# Patient Record
Sex: Male | Born: 1965 | Race: White | Hispanic: No | Marital: Married | Smoking: Never smoker
Health system: Southern US, Community
[De-identification: ages and names within clinical notes are randomized; demographics above are authoritative.]

## PROBLEM LIST (undated history)

## (undated) DIAGNOSIS — I10 Essential (primary) hypertension: Secondary | ICD-10-CM

## (undated) DIAGNOSIS — E119 Type 2 diabetes mellitus without complications: Secondary | ICD-10-CM

## (undated) HISTORY — PX: MEDIAL COLLATERAL LIGAMENT AND LATERAL COLLATERAL LIGAMENT REPAIR, KNEE: SHX2017

## (undated) HISTORY — PX: GASTRIC BYPASS: SHX52

---

## 2018-03-05 ENCOUNTER — Emergency Department (HOSPITAL_BASED_OUTPATIENT_CLINIC_OR_DEPARTMENT_OTHER)
Admission: EM | Admit: 2018-03-05 | Discharge: 2018-03-05 | Disposition: A | Discharge status: Home / Self Care | Payer: Medicare Other | Attending: Emergency Medicine | Admitting: Emergency Medicine

## 2018-03-05 ENCOUNTER — Other Ambulatory Visit: Payer: Self-pay

## 2018-03-05 ENCOUNTER — Encounter (HOSPITAL_BASED_OUTPATIENT_CLINIC_OR_DEPARTMENT_OTHER): Payer: Self-pay | Admitting: Emergency Medicine

## 2018-03-05 ENCOUNTER — Emergency Department (HOSPITAL_BASED_OUTPATIENT_CLINIC_OR_DEPARTMENT_OTHER): Payer: Medicare Other

## 2018-03-05 DIAGNOSIS — I10 Essential (primary) hypertension: Secondary | ICD-10-CM | POA: Insufficient documentation

## 2018-03-05 DIAGNOSIS — M79604 Pain in right leg: Secondary | ICD-10-CM

## 2018-03-05 DIAGNOSIS — M545 Low back pain, unspecified: Secondary | ICD-10-CM

## 2018-03-05 DIAGNOSIS — Z7984 Long term (current) use of oral hypoglycemic drugs: Secondary | ICD-10-CM | POA: Diagnosis not present

## 2018-03-05 DIAGNOSIS — W19XXXA Unspecified fall, initial encounter: Secondary | ICD-10-CM

## 2018-03-05 DIAGNOSIS — Y9301 Activity, walking, marching and hiking: Secondary | ICD-10-CM | POA: Diagnosis not present

## 2018-03-05 DIAGNOSIS — Z79899 Other long term (current) drug therapy: Secondary | ICD-10-CM | POA: Insufficient documentation

## 2018-03-05 DIAGNOSIS — W010XXA Fall on same level from slipping, tripping and stumbling without subsequent striking against object, initial encounter: Secondary | ICD-10-CM | POA: Diagnosis not present

## 2018-03-05 DIAGNOSIS — Y92813 Airplane as the place of occurrence of the external cause: Secondary | ICD-10-CM | POA: Insufficient documentation

## 2018-03-05 DIAGNOSIS — E119 Type 2 diabetes mellitus without complications: Secondary | ICD-10-CM | POA: Insufficient documentation

## 2018-03-05 HISTORY — DX: Type 2 diabetes mellitus without complications: E11.9

## 2018-03-05 HISTORY — DX: Essential (primary) hypertension: I10

## 2018-03-05 MED ORDER — METHOCARBAMOL 500 MG PO TABS
750.0000 mg | ORAL_TABLET | Freq: Once | ORAL | Status: DC
  Filled 2018-03-05: qty 2

## 2018-03-05 MED ORDER — ACETAMINOPHEN 500 MG PO TABS
1000.0000 mg | ORAL_TABLET | Freq: Once | ORAL | Status: AC
  Administered 2018-03-05: 1000 mg via ORAL
  Filled 2018-03-05: qty 2

## 2018-03-05 MED ORDER — KETOROLAC TROMETHAMINE 15 MG/ML IJ SOLN
15.0000 mg | Freq: Once | INTRAMUSCULAR | Status: AC
  Administered 2018-03-05: 15 mg via INTRAMUSCULAR
  Filled 2018-03-05: qty 1

## 2018-03-05 MED ORDER — OXYCODONE-ACETAMINOPHEN 5-325 MG PO TABS
1.0000 | ORAL_TABLET | Freq: Once | ORAL | Status: DC
  Filled 2018-03-05: qty 1

## 2018-03-05 NOTE — ED Notes (Signed)
Patient transported to X-ray 

## 2018-03-05 NOTE — ED Notes (Signed)
Pt ambulated w/o difficulty. PA notified.

## 2018-03-05 NOTE — ED Notes (Signed)
Patient left at this time with all belongings. 

## 2018-03-05 NOTE — Discharge Instructions (Addendum)
Take your home pain medications as prescribed. Take your home muscle relaxers as prescribed. Use ice to help with pain and swelling. Use muscle creams (bengay, icy hot, salonpas) as needed for pain.  You will likely have continued muscle stiffness and soreness over the neck several days.  Follow-up with your primary care doctor in 1 week if your pain is not improving. Return to the ER if you develop also bowel bladder control, numbness, inability to walk, or any new, worsening, or concerning symptoms.

## 2018-03-05 NOTE — ED Notes (Signed)
Held pain meds, pt reports he is driving himself home.

## 2018-03-05 NOTE — ED Provider Notes (Signed)
[facility] EMERGENCY DEPARTMENT Provider Note   CSN: [id number] Arrival date & time: 03/05/18  1927     History   Chief Complaint Chief Complaint  Patient presents with  . Fall    HPI Kurt Graham is a 53 y.o. male presenting for evaluation after a fall.  Patient states he was disembarking from an airplane where the ramp was wet, and he slipped.  His right leg bent under him, and then was extended straight.  He landed on his low back.  He denies hitting his head or loss of consciousness.  He denies head or neck pain.  He is not on blood thinners.  He reports significant pain of his low back, right leg, right knee, and right ankle.  He has not taken anything for pain including Tylenol or ibuprofen.  Movement and palpation makes the pain worse, nothing make it better.  He denies numbness or tingling.  He denies upper back pain, chest pain breath, nausea, vomiting, abdominal pain, loss of bowel bladder control.  Patient states he is on chronic pain medication, but has not had any recently due to travel.  HPI  Past Medical History:  Diagnosis Date  . Diabetes mellitus without complication (HCC)   . Hypertension     There are no active problems to display for this patient.   Past Surgical History:  Procedure Laterality Date  . GASTRIC BYPASS    . MEDIAL COLLATERAL LIGAMENT AND LATERAL COLLATERAL LIGAMENT REPAIR, KNEE Right         Home Medications    Prior to Admission medications   Medication Sig Start Date End Date Taking? Authorizing Provider  benazepril (LOTENSIN) 40 MG tablet TAKE ONE TABLET BY MOUTH DAILY 07/23/14  Yes [provider]  metFORMIN (GLUCOPHAGE) 1000 MG tablet TAKE 1 TABLET (1,000 MG TOTAL) BY MOUTH 2 TIMES A DAY WITH MEALS 08/24/14  Yes [provider]  oxyCODONE-acetaminophen (PERCOCET) 10-325 MG tablet Take by mouth. 02/19/18 04/20/18 Yes [provider]    Family History No family history on file.  Social  History Social History   Tobacco Use  . Smoking status: Never Smoker  . Smokeless tobacco: Never Used  Substance Use Topics  . Alcohol use: Not Currently  . Drug use: Not Currently     Allergies   Other   Review of Systems Review of Systems  Musculoskeletal: Positive for arthralgias and back pain.  All other systems reviewed and are negative.    Physical Exam Updated Vital Signs BP (!) 149/105 (BP Location: Right Arm) Comment (BP Location): Right forearm  Pulse 87   Temp (!) 97.5 F (36.4 C) (Oral)   Resp 20   Ht 6\' 1"  (1.854 m)   Wt (!) 181.4 kg   SpO2 100%   BMI 52.77 kg/m   Physical Exam Vitals signs and nursing note reviewed.  Constitutional:      General: He is not in acute distress.    Appearance: He is well-developed.     Comments: Obese M in NAD  HENT:     Head: Normocephalic and atraumatic.     Comments: No obvious head injury Eyes:     Extraocular Movements: Extraocular movements intact.     Conjunctiva/sclera: Conjunctivae normal.     Pupils: Pupils are equal, round, and reactive to light.  Neck:     Musculoskeletal: Normal range of motion and neck supple.     Comments: No tenderness to palpation of neck or C-spine.  Moving head  easily without pain Cardiovascular:     Rate and Rhythm: Normal rate and regular rhythm.     Pulses: Normal pulses.  Pulmonary:     Effort: Pulmonary effort is normal. No respiratory distress.     Breath sounds: Normal breath sounds. No wheezing.  Abdominal:     General: There is no distension.     Palpations: Abdomen is soft. There is no mass.     Tenderness: There is no abdominal tenderness. There is no guarding or rebound.  Musculoskeletal: Normal range of motion.     Comments: No obvious deformity, rotation, shortening of the right leg.  Tenderness palpation of the right knee, thigh, hamstring.  No tenderness palpation of the hip or pelvis.  Tenderness palpation of low back bilaterally without increased pain over  the midline spine.  No step-offs or deformities.  No saddle paresthesias.  Pedal pulses intact bilaterally.  Sensation intact bilaterally.  Tenderness palpation of dorsal foot without swelling or deformity.  Good cap refill of all toes.  Skin:    General: Skin is warm and dry.     Capillary Refill: Capillary refill takes less than 2 seconds.  Neurological:     Mental Status: He is alert and oriented to person, place, and time.      ED Treatments / Results  Labs (all labs ordered are listed, but only abnormal results are displayed) Labs Reviewed - No data to display  EKG None  Radiology Dg Lumbar Spine Complete  Result Date: 03/05/2018 CLINICAL DATA:  Fall with back pain EXAM: LUMBAR SPINE - COMPLETE 4+ VIEW COMPARISON:  None. FINDINGS: Grade 1 anterolisthesis at L4-L5. Vertebral body heights are maintained. Disc spaces are normal. There is moderate facet hypertrophy at L4-5. IMPRESSION: Grade 1 anterolisthesis at L4-5 with moderate facet hypertrophy at this level. No acute fracture. Electronically Signed   By: Deatra RobinsonKevin  Herman M.D.   On: 03/05/2018 22:02   Dg Ankle Complete Right  Result Date: 03/05/2018 CLINICAL DATA:  Pain post slipping on wet floor. EXAM: RIGHT ANKLE - COMPLETE 3+ VIEW COMPARISON:  None. FINDINGS: There is no evidence of fracture, dislocation, or joint effusion. There is no evidence of arthropathy or other focal bone abnormality. Soft tissues are unremarkable. IMPRESSION: Negative. Electronically Signed   By: Ted Mcalpineobrinka  Dimitrova M.D.   On: 03/05/2018 22:02   Dg Knee Complete 4 Views Right  Result Date: 03/05/2018 CLINICAL DATA:  Fall with right knee pain EXAM: RIGHT KNEE - COMPLETE 4+ VIEW COMPARISON:  None. FINDINGS: Moderate femorotibial osteoarthrosis, medial compartment predominant. No fracture or dislocation. No joint effusion. Moderate patellofemoral osteoarthrosis. IMPRESSION: 1. No acute fracture or dislocation of the right knee. 2. Moderate femorotibial and  patellofemoral osteoarthrosis. Electronically Signed   By: Deatra RobinsonKevin  Herman M.D.   On: 03/05/2018 22:08   Dg Hip Unilat W Or Wo Pelvis 2-3 Views Right  Result Date: 03/05/2018 CLINICAL DATA:  Fall EXAM: DG HIP (WITH OR WITHOUT PELVIS) 2-3V RIGHT COMPARISON:  None. FINDINGS: There is no evidence of hip fracture or dislocation. No pelvic diastasis or fracture. There is no evidence of arthropathy or other focal bone abnormality. IMPRESSION: No fracture or dislocation of the right hip. Electronically Signed   By: Deatra RobinsonKevin  Herman M.D.   On: 03/05/2018 22:04   Dg Femur Min 2 Views Right  Result Date: 03/05/2018 CLINICAL DATA:  Fall with right knee pain EXAM: RIGHT FEMUR 2 VIEWS COMPARISON:  None. FINDINGS: There is no evidence of fracture or other focal bone lesions. Soft  tissues are unremarkable. IMPRESSION: No fracture or dislocation of the right femur. Electronically Signed   By: Deatra RobinsonKevin  Herman M.D.   On: 03/05/2018 22:03    Procedures Procedures (including critical care time)  Medications Ordered in ED Medications  oxyCODONE-acetaminophen (PERCOCET/ROXICET) 5-325 MG per tablet 1 tablet (0 tablets Oral Hold 03/05/18 2044)  methocarbamol (ROBAXIN) tablet 750 mg (0 mg Oral Hold 03/05/18 2044)  ketorolac (TORADOL) 15 MG/ML injection 15 mg (15 mg Intramuscular Given 03/05/18 2229)  acetaminophen (TYLENOL) tablet 1,000 mg (1,000 mg Oral Given 03/05/18 2229)     Initial Impression / Assessment and Plan / ED Course  I have reviewed the triage vital signs and the nursing notes.  Pertinent labs & imaging results that were available during my care of the patient were reviewed by me and considered in my medical decision making (see chart for details).     Patient presenting for evaluation of low back pain and right leg pain after a mechanical fall.  Physical exam reassuring, he is neurovascularly intact.  Patient with a history of degenerative disc disease in his back, history of MCL repair of his right knee.   Reporting significant pain.  Will obtain x-rays of his back, pelvis, femur, knee, and ankle for further evaluation.  Offered his home medication of Percocet and muscle relaxers, but patient is about to drive so declined.  Will hold on pain medication at this time.  X-rays viewed interpreted by me, no fracture dislocation.  Does show degenerative shin of his low back and arthritis of the right knee.  Discussed findings with patient.  Will give Toradol IM and high-dose Tylenol for pain control.  Patient ambulated without crutches and without significant difficulty.  Discussed typical course of muscle stiffness over the neck several days.  As patient has pain medication muscle relaxers at home, will not add.  Discussed use of muscle creams, rest, heat, ice.  With PCP if symptoms not improving.  At this time, patient appears safe for discharge.  Return precautions given.  Patient states he understands and agrees plan.  Final Clinical Impressions(s) / ED Diagnoses   Final diagnoses:  Fall, initial encounter  Midline low back pain without sciatica, unspecified chronicity  Right leg pain    ED Discharge Orders    None       Alveria ApleyCaccavale, Tabor Denham, PA-C 03/05/18 2333    Pricilla LovelessGoldston, Scott, MD 03/05/18 [id number]

## 2018-03-05 NOTE — ED Triage Notes (Signed)
[place]. Reports R knee pain and R back pain. Hx diabetes and HTN.

## 2020-03-08 IMAGING — CR DG KNEE COMPLETE 4+V*R*
4 series · 4 of 4 positions shown · non-contrast
Comparison: None.

CLINICAL DATA: Fall with right knee pain

EXAM:
RIGHT KNEE - COMPLETE 4+ VIEW

[t knee ap right *]
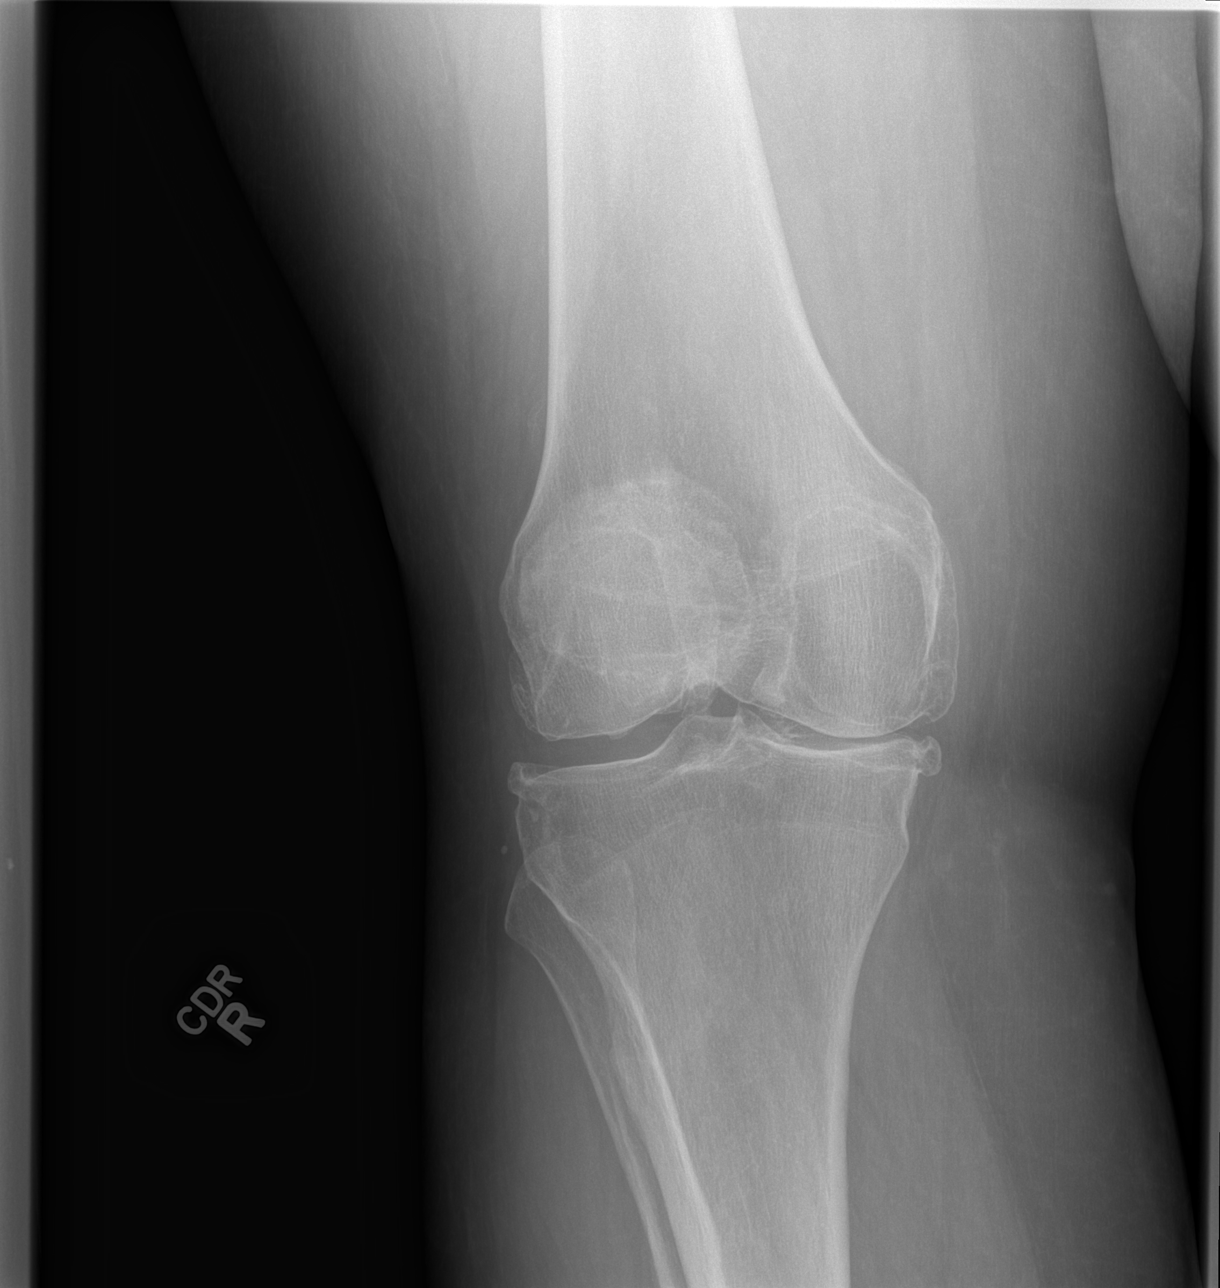

[t knee oblique right * (1 of 2)]
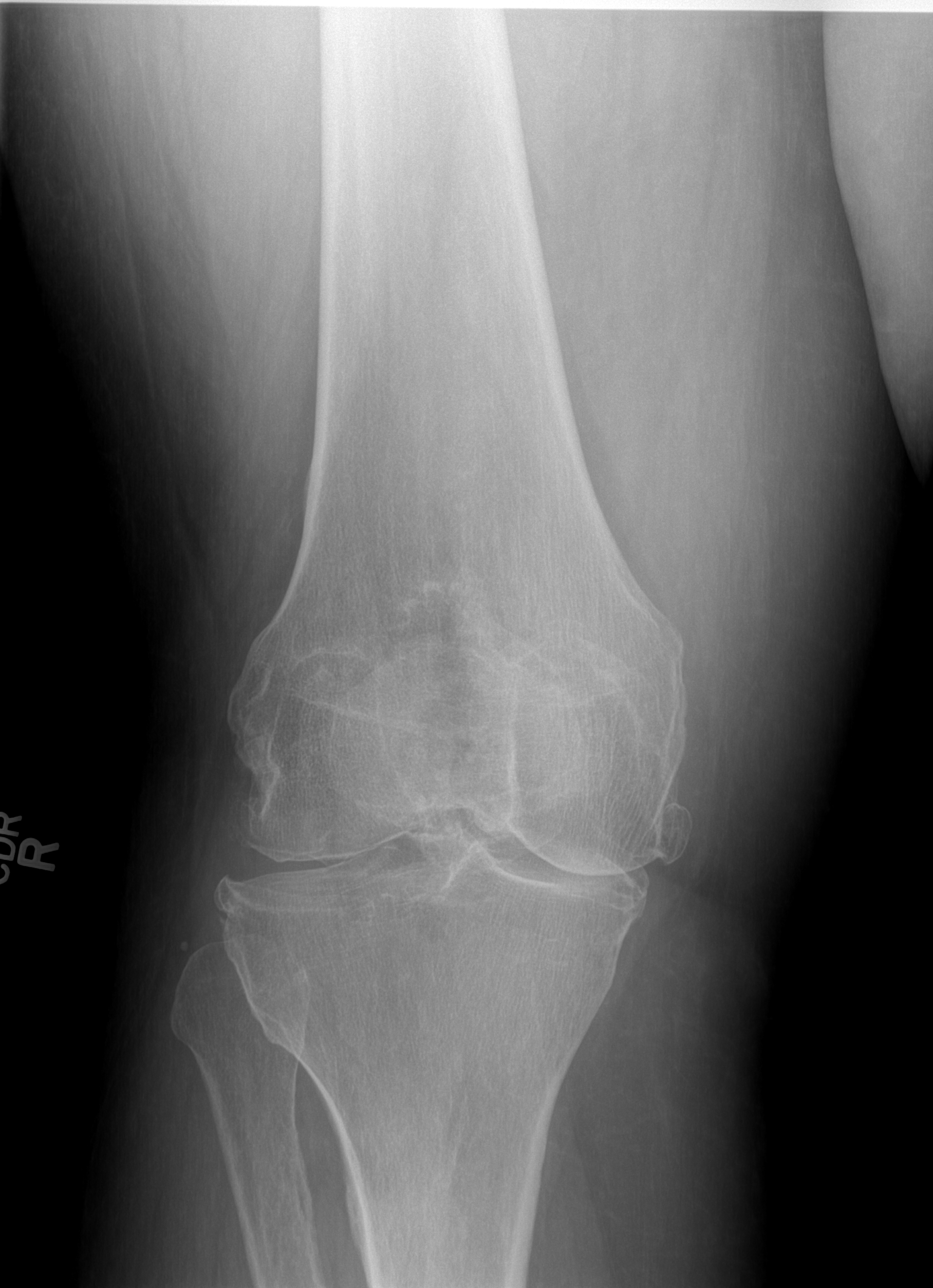

[t knee oblique right * (2 of 2)]
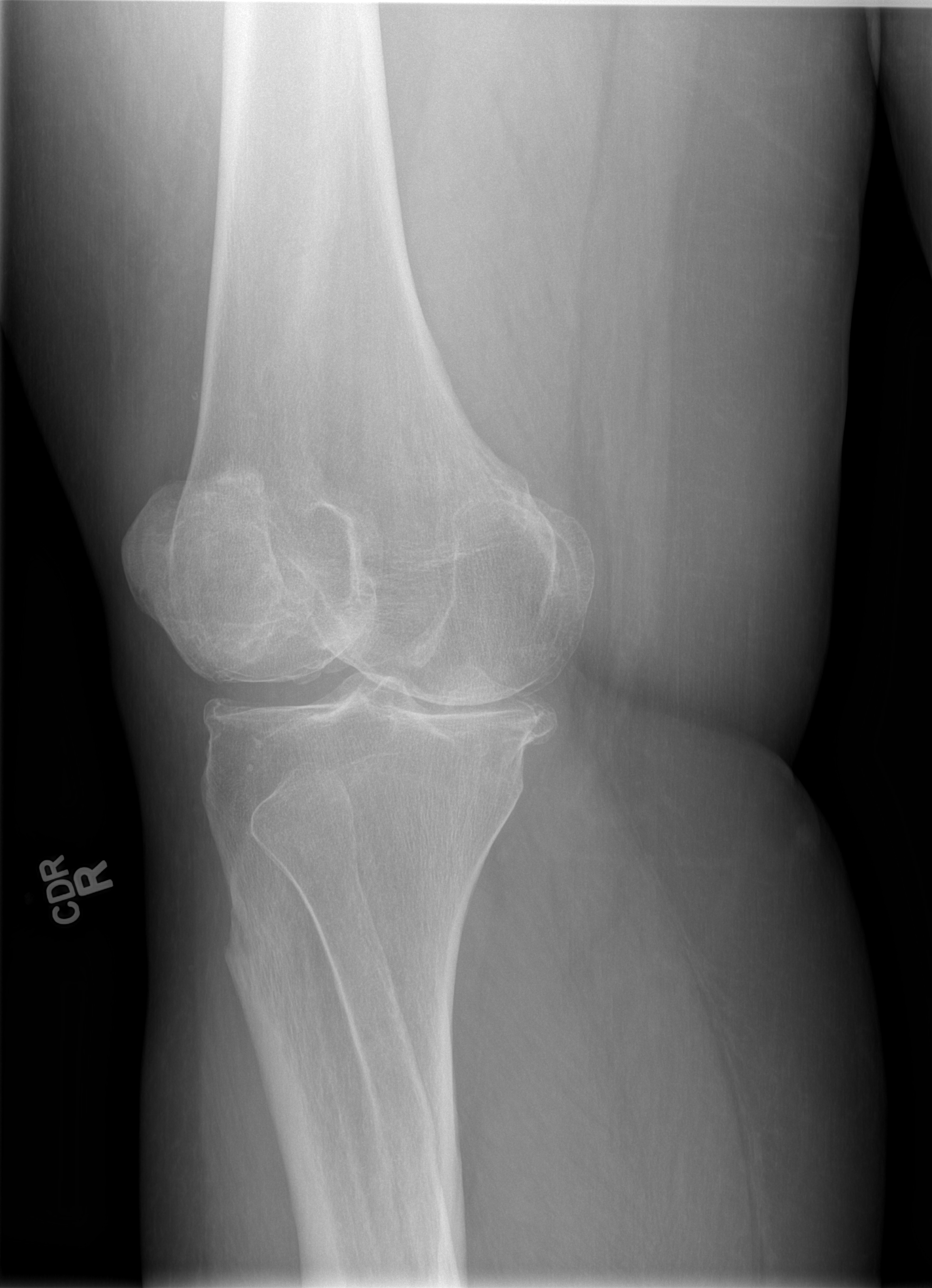

[t knee lat right *]
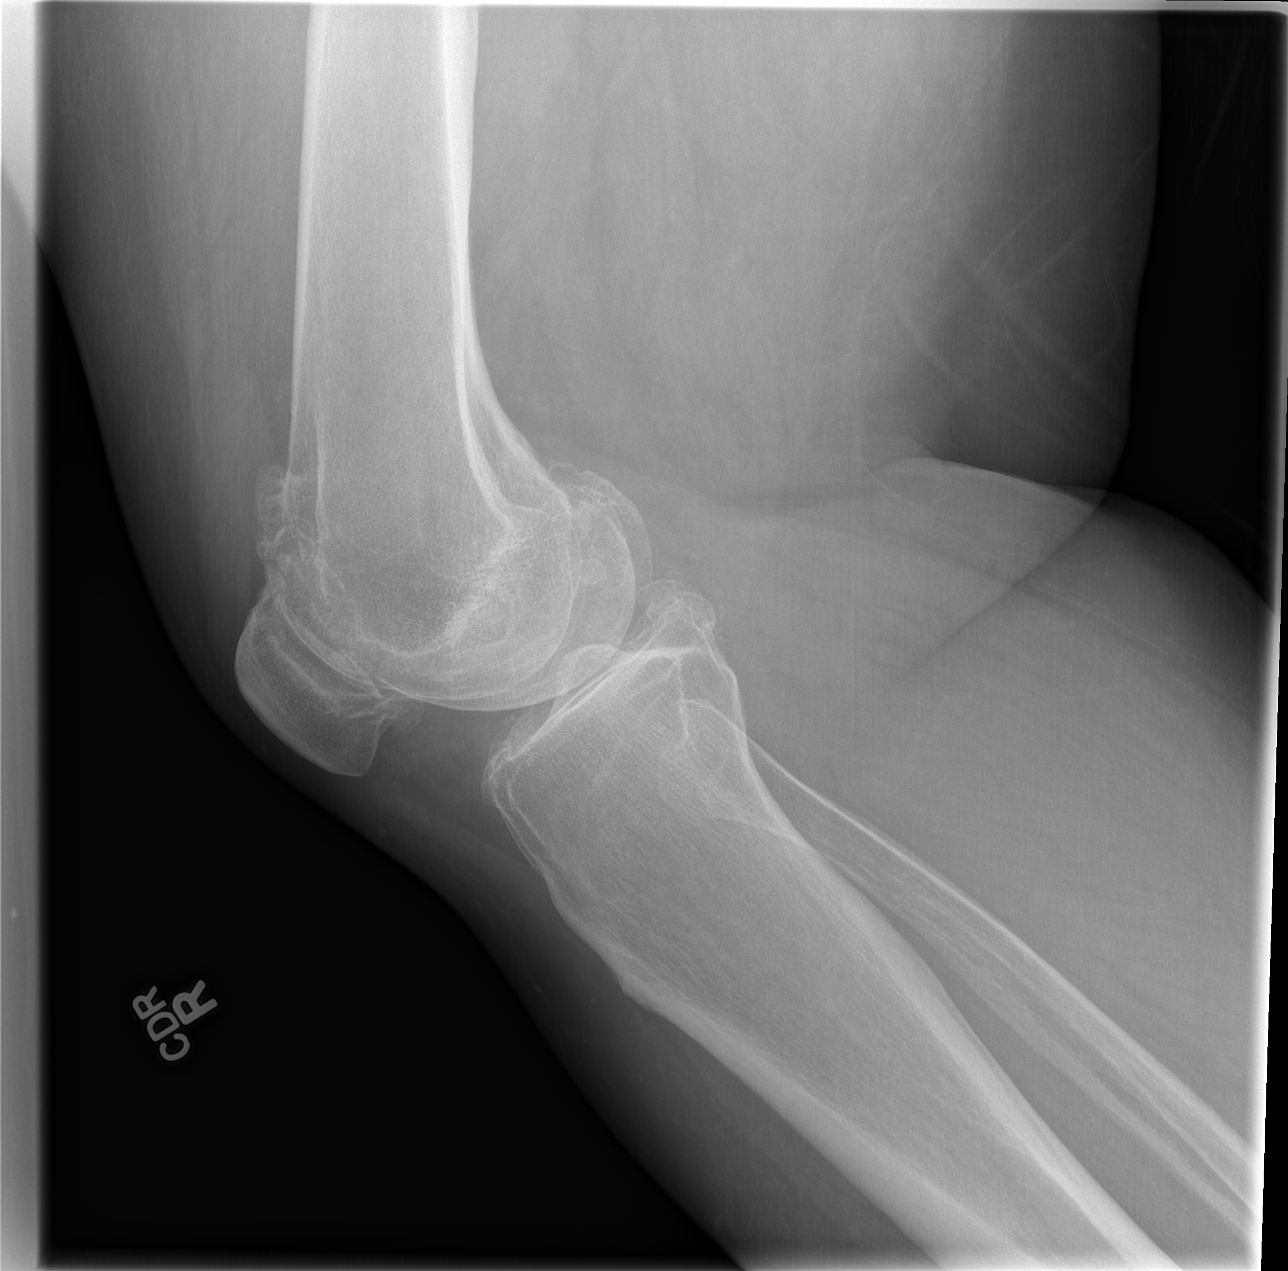

[4 of 4 positions shown; findings below may reference images not displayed]

FINDINGS: Moderate femorotibial osteoarthrosis, medial compartment
predominant. No fracture or dislocation. No joint effusion. Moderate
patellofemoral osteoarthrosis.
IMPRESSION: 1. No acute fracture or dislocation of the right knee.
2. Moderate femorotibial and patellofemoral osteoarthrosis.
